# Patient Record
Sex: Male | Born: 2000 | Race: Black or African American | Hispanic: No | Marital: Single | Smoking: Former smoker
Health system: Southern US, Community
[De-identification: ages and names within clinical notes are randomized; demographics above are authoritative.]

---
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
## 2018-03-19 ENCOUNTER — Ambulatory Visit: Payer: Medicaid Other | Admitting: Neurology

## 2018-03-19 ENCOUNTER — Encounter

## 2018-10-02 IMAGING — MR MR HEAD WO/W CM
12 series · 42 of 48 positions shown · IV contrast (multihance)
Comparison: None.

CLINICAL DATA: BILATERAL tinnitus, RIGHT being worse. Previous
episode of syncope.

EXAM:
MRI HEAD WITHOUT AND WITH CONTRAST
TECHNIQUE: Multiplanar, multiecho pulse sequences of the brain and surrounding
structures were obtained without and with intravenous contrast.
CONTRAST:  14mL MULTIHANCE GADOBENATE DIMEGLUMINE 529 MG/ML IV SOLN

[Series 2: T1 · sagittal · 5.0mm · 0.45mm/px · 2 of 21 slices shown (1 of 3)]
[im 1/21]
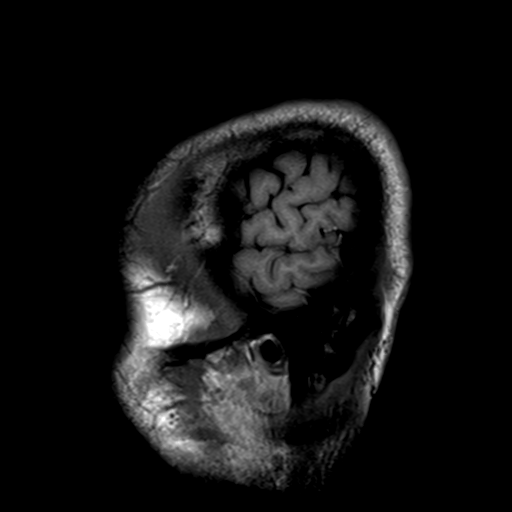
[im 21/21]
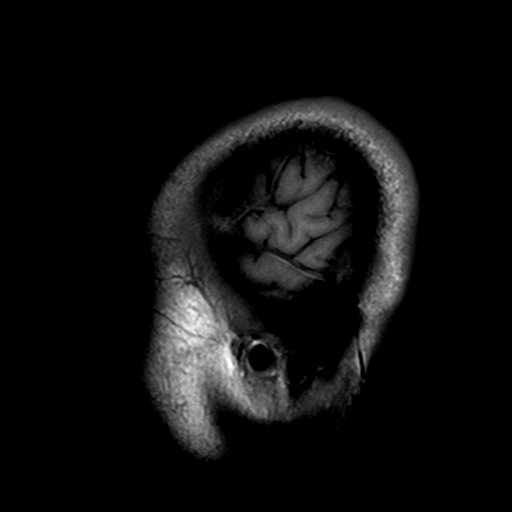

[Series 3: DWI · axial · 3.0mm · 1.80mm/px · z∈[-66,+81]mm · 8 of 98 slices shown (1 of 2)]
[im 1/98]
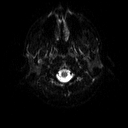
[im 11/98]
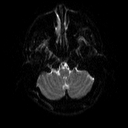
[im 33/98]
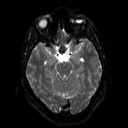
[im 44/98]
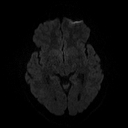
[im 54/98]
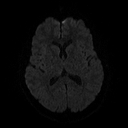
[im 65/98]
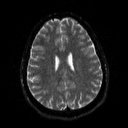
[im 87/98]
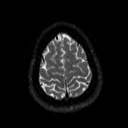
[im 98/98]
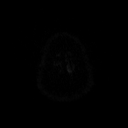

[Series 4: DWI · axial · 3.0mm · 1.80mm/px · z∈[-66,+81]mm · 5 of 48 slices shown (2 of 2)]
[im 1/48]
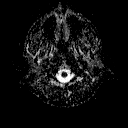
[im 12/48]
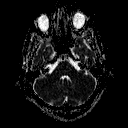
[im 24/48]
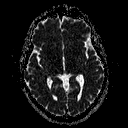
[im 36/48]
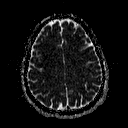
[im 48/48]
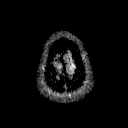

[Series 5: T2 · axial · 5.0mm · 0.51mm/px · z∈[-70,+86]mm · 3 of 25 slices shown]
[im 1/25]
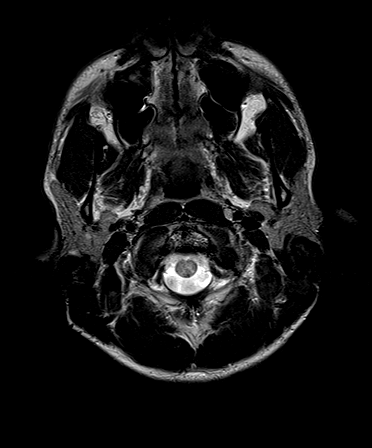
[im 13/25]
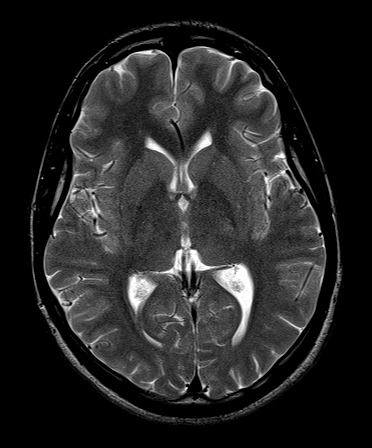
[im 25/25]
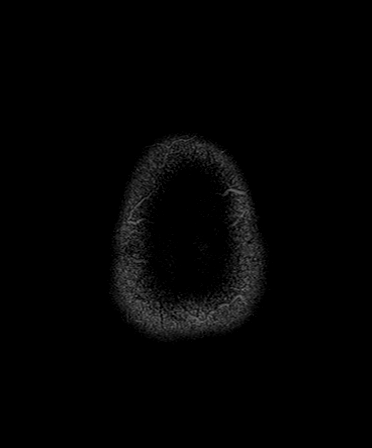

[Series 6: FLAIR · axial · 3.0mm · 0.45mm/px · z∈[-70,+86]mm · 3 of 27 slices shown]
[im 1/27]
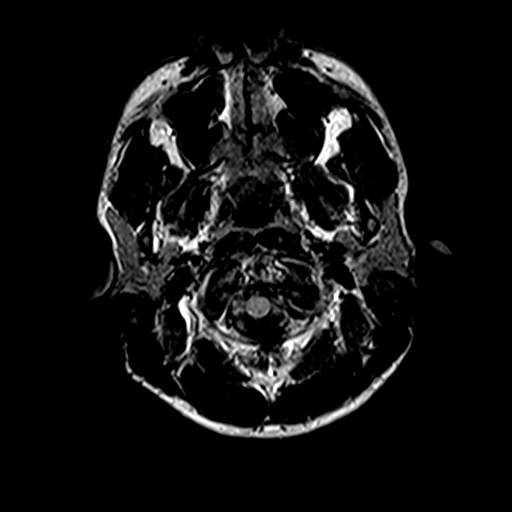
[im 14/27]
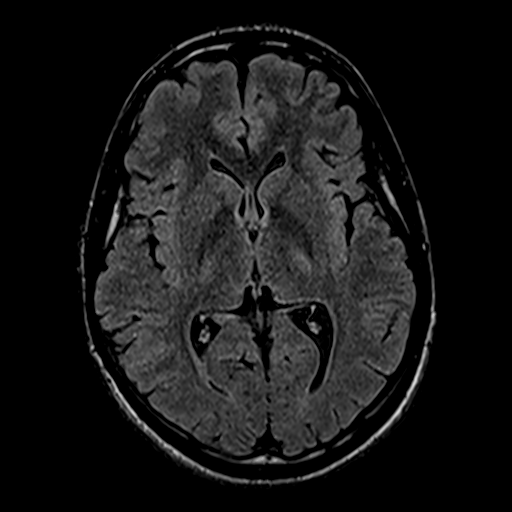
[im 27/27]
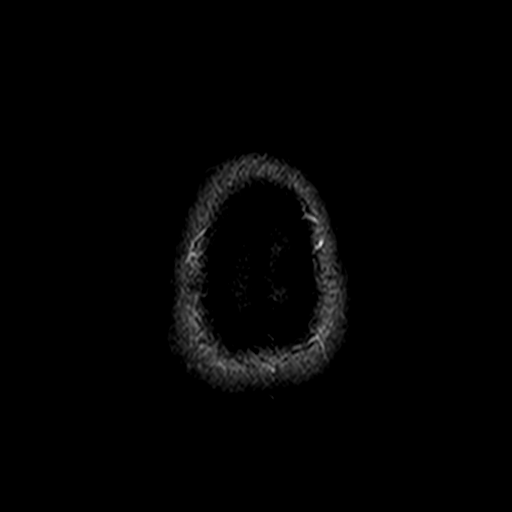

[Series 8: swi_images · axial · 2.0mm · 0.90mm/px · z∈[-71,+87]mm · 8 of 80 slices shown]
[im 1/80]
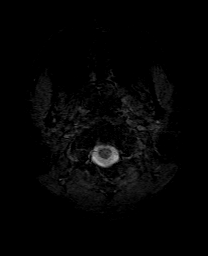
[im 10/80]
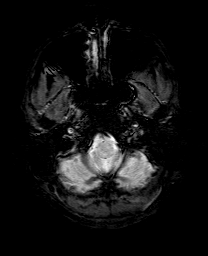
[im 20/80]
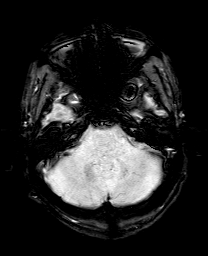
[im 30/80]
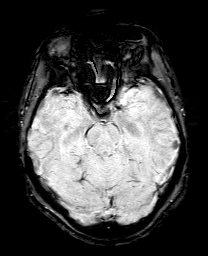
[im 50/80]
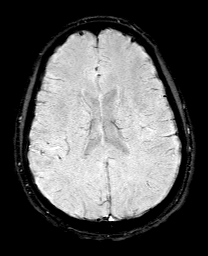
[im 60/80]
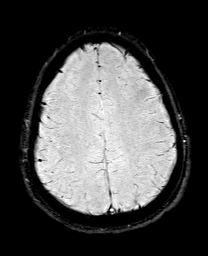
[im 70/80]
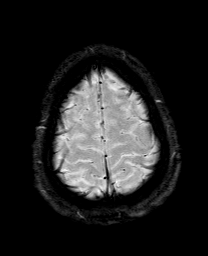
[im 80/80]
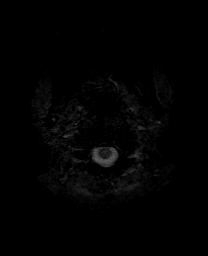

[Series 9: T1 · coronal · 3.0mm · 0.35mm/px · 1 of 11 slices shown (2 of 3)]
[im 1/11]
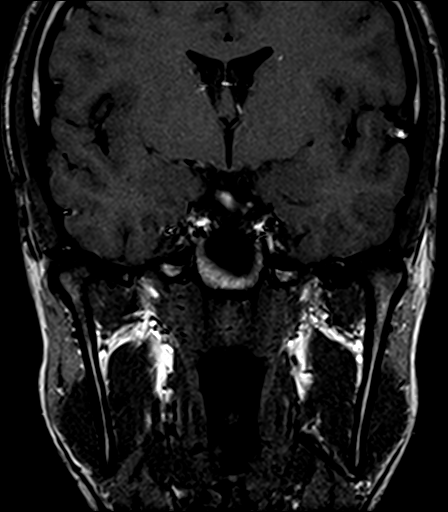

[Series 10: T1 · axial · 3.0mm · 0.35mm/px · 1 of 11 slices shown (3 of 3)]
[im 1/11]
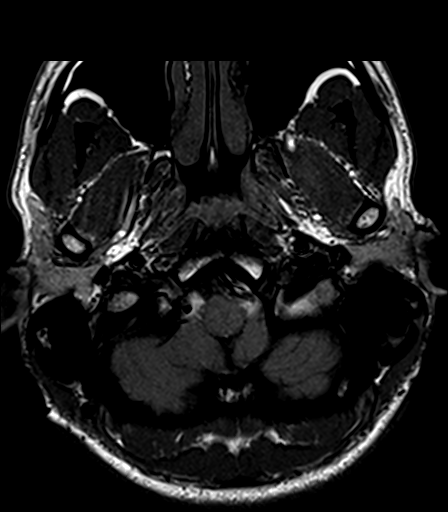

[Series 12: bSSFP · axial · 1.0mm · 0.47mm/px · z∈[-52,-17]mm · 4 of 36 slices shown]
[im 1/36]
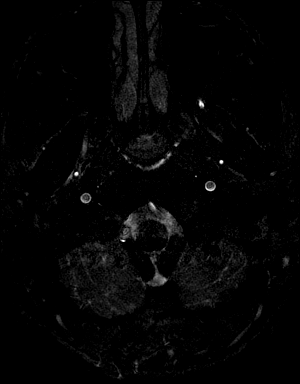
[im 12/36]
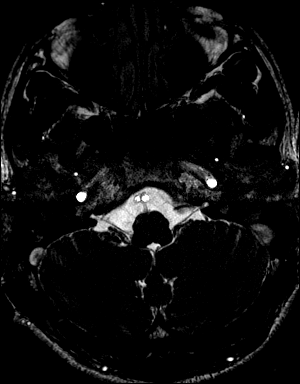
[im 24/36]
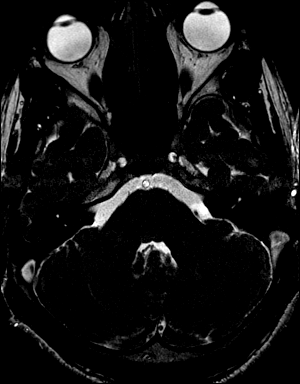
[im 36/36]
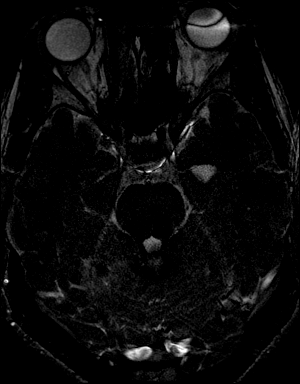

[Series 13: T1 post-contrast · coronal · 3.0mm · 0.35mm/px · 1 of 11 slices shown (1 of 2)]
[im 1/11]
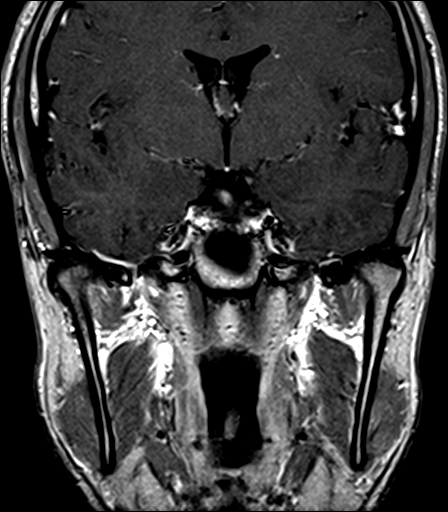

[Series 14: T1 post-contrast · axial · 3.0mm · 0.35mm/px · 1 of 11 slices shown (2 of 2)]
[im 1/11]
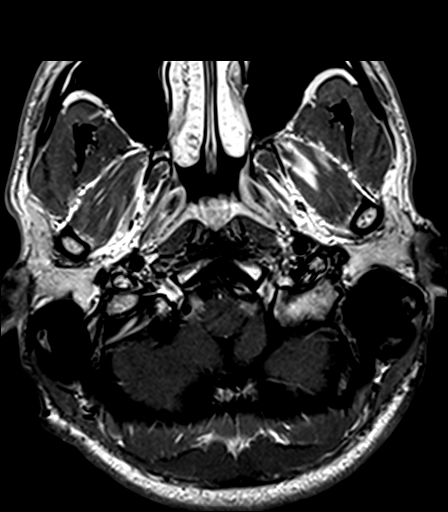

[Series 15: post_t1_mpr_tra · axial · 2.0mm · 0.45mm/px · z∈[-63,+17]mm · 5 of 72 slices shown]
[im 1/72]
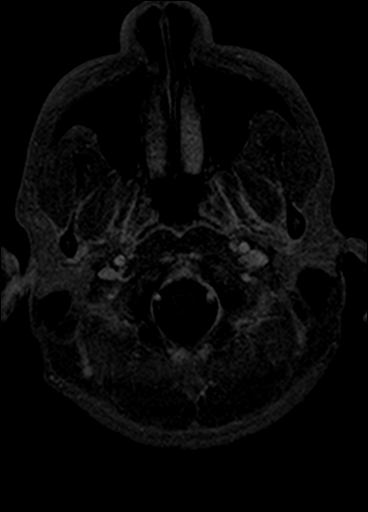
[im 11/72]
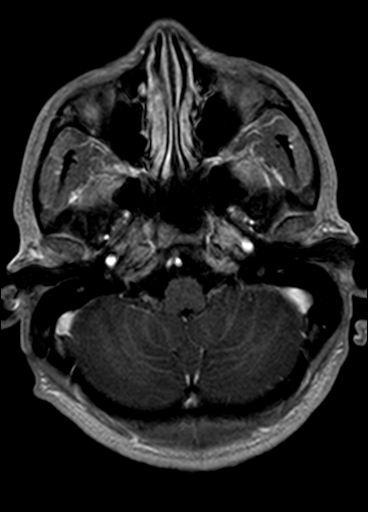
[im 21/72]
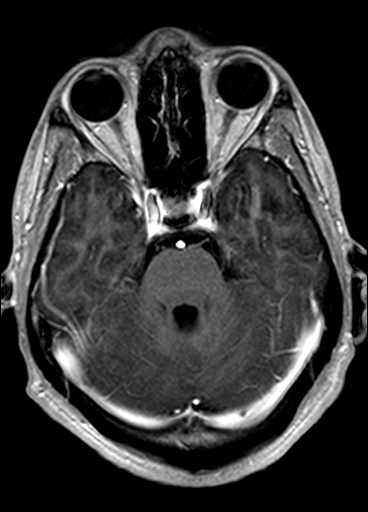
[im 31/72]
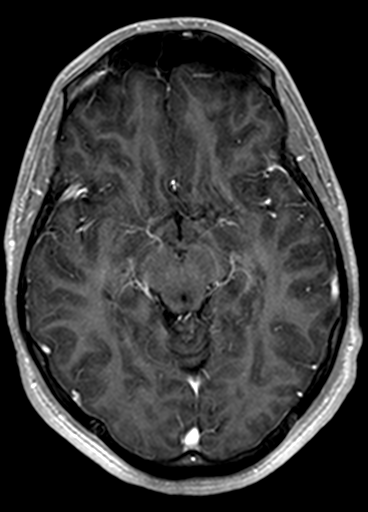
[im 41/72]
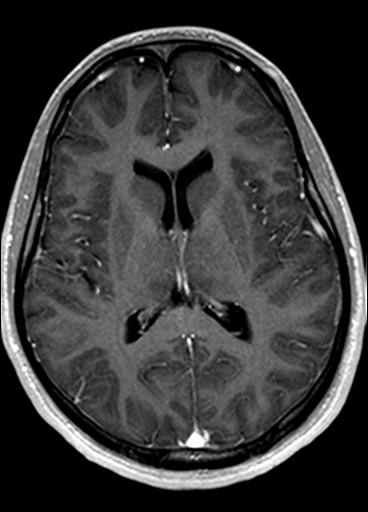

[42 of 48 positions shown; findings below may reference images not displayed]

FINDINGS: Brain: No evidence for acute infarction, hemorrhage, mass lesion,
hydrocephalus, or extra-axial fluid.

Normal cerebral volume. No white matter disease. Unremarkable
temporal lobes. No significant asymmetry or mass.

Thin-section imaging through the posterior fossa reveals normal
seventh and eighth nerve complex. No evidence for vestibular
schwannoma, posterior fossa mass, or temporal bone inflammatory
process. No vascular loop. Post infusion imaging through the entire
head demonstrates no abnormal enhancement of brain or meninges

Vascular: Normal flow voids.

Skull and upper cervical spine: Normal marrow signal.

Sinuses/Orbits: No significant paranasal sinus disease.

Other: None.
IMPRESSION: Negative exam.  No cause is seen for the reported symptoms.

## 2022-04-15 ENCOUNTER — Other Ambulatory Visit: Payer: Self-pay

## 2022-04-15 ENCOUNTER — Emergency Department (HOSPITAL_COMMUNITY)
Admission: EM | Admit: 2022-04-15 | Discharge: 2022-04-16 | Disposition: A | Discharge status: Home / Self Care | Payer: Managed Care, Other (non HMO) | Attending: Emergency Medicine | Admitting: Emergency Medicine

## 2022-04-15 ENCOUNTER — Encounter (HOSPITAL_COMMUNITY): Payer: Self-pay | Admitting: Emergency Medicine

## 2022-04-15 DIAGNOSIS — R110 Nausea: Secondary | ICD-10-CM | POA: Diagnosis not present

## 2022-04-15 DIAGNOSIS — R1031 Right lower quadrant pain: Secondary | ICD-10-CM | POA: Insufficient documentation

## 2022-04-15 LAB — URINALYSIS, ROUTINE W REFLEX MICROSCOPIC
Bilirubin Urine: NEGATIVE
Glucose, UA: NEGATIVE mg/dL
Hgb urine dipstick: NEGATIVE
Ketones, ur: NEGATIVE mg/dL
Leukocytes,Ua: NEGATIVE
Nitrite: NEGATIVE
Protein, ur: NEGATIVE mg/dL
Specific Gravity, Urine: 1.019 (ref 1.005–1.030)
pH: 6 (ref 5.0–8.0)

## 2022-04-15 LAB — CBC
HCT: 38.4 % — ABNORMAL LOW (ref 39.0–52.0)
Hemoglobin: 13 g/dL (ref 13.0–17.0)
MCH: 28 pg (ref 26.0–34.0)
MCHC: 33.9 g/dL (ref 30.0–36.0)
MCV: 82.8 fL (ref 80.0–100.0)
Platelets: 201 10*3/uL (ref 150–400)
RBC: 4.64 MIL/uL (ref 4.22–5.81)
RDW: 13.6 % (ref 11.5–15.5)
WBC: 6.8 10*3/uL (ref 4.0–10.5)
nRBC: 0 % (ref 0.0–0.2)

## 2022-04-15 NOTE — ED Triage Notes (Signed)
Pt c/o RLQ pain and nausea that started 2 hours ago. Denies vomiting/diarrhea.

## 2022-04-16 ENCOUNTER — Emergency Department (HOSPITAL_COMMUNITY): Payer: Managed Care, Other (non HMO)

## 2022-04-16 ENCOUNTER — Encounter (HOSPITAL_COMMUNITY): Payer: Self-pay

## 2022-04-16 LAB — COMPREHENSIVE METABOLIC PANEL
ALT: 12 U/L (ref 0–44)
AST: 21 U/L (ref 15–41)
Albumin: 4.2 g/dL (ref 3.5–5.0)
Alkaline Phosphatase: 39 U/L (ref 38–126)
Anion gap: 11 (ref 5–15)
BUN: 15 mg/dL (ref 6–20)
CO2: 25 mmol/L (ref 22–32)
Calcium: 9.4 mg/dL (ref 8.9–10.3)
Chloride: 104 mmol/L (ref 98–111)
Creatinine, Ser: 1.19 mg/dL (ref 0.61–1.24)
GFR, Estimated: >60 mL/min (ref >60)
Glucose, Bld: 102 mg/dL — ABNORMAL HIGH (ref 70–99)
Potassium: 3.2 mmol/L — ABNORMAL LOW (ref 3.5–5.1)
Sodium: 140 mmol/L (ref 135–145)
Total Bilirubin: 0.5 mg/dL (ref 0.3–1.2)
Total Protein: 6.6 g/dL (ref 6.5–8.1)

## 2022-04-16 LAB — LIPASE, BLOOD: Lipase: 26 U/L (ref 11–51)

## 2022-04-16 MED ORDER — LACTATED RINGERS IV BOLUS
1000.0000 mL | Freq: Once | INTRAVENOUS | Status: AC
  Administered 2022-04-16: 1000 mL via INTRAVENOUS

## 2022-04-16 MED ORDER — IOHEXOL 350 MG/ML SOLN
100.0000 mL | Freq: Once | INTRAVENOUS | Status: AC | PRN
  Administered 2022-04-16: 75 mL via INTRAVENOUS

## 2022-04-16 NOTE — ED Notes (Signed)
Patient transported to CT 

## 2022-04-16 NOTE — ED Provider Notes (Signed)
[facility] EMERGENCY DEPARTMENT Provider Note   CSN: [id number] Arrival date & time: 04/15/22  2313     History  Chief Complaint  Patient presents with   Abdominal Pain    Tyler Gill is a 21 y.o. male.  Patient presents to the emergency department for evaluation of abdominal pain.  Patient developed right lower abdominal pain 2 hours before coming to the ER.  Patient reports nausea, no vomiting or diarrhea.  Pain has eased off somewhat since it started but is still present.       Home Medications Prior to Admission medications   Medication Sig Start Date End Date Taking? Authorizing Provider  Omega-3 1000 MG CAPS Take 1 capsule by mouth daily.    [provider]  sertraline (ZOLOFT) 25 MG tablet Take 1 tablet daily 06/24/17   Van Clines, MD      Allergies    Patient has no known allergies.    Review of Systems   Review of Systems  Physical Exam Updated Vital Signs BP 110/68   Pulse 66   Temp 98 F (36.7 C) (Oral)   Resp 12   SpO2 99%  Physical Exam Vitals and nursing note reviewed.  Constitutional:      General: He is not in acute distress.    Appearance: He is well-developed.  HENT:     Head: Normocephalic and atraumatic.     Mouth/Throat:     Mouth: Mucous membranes are moist.  Eyes:     General: Vision grossly intact. Gaze aligned appropriately.     Extraocular Movements: Extraocular movements intact.     Conjunctiva/sclera: Conjunctivae normal.  Cardiovascular:     Rate and Rhythm: Normal rate and regular rhythm.     Pulses: Normal pulses.     Heart sounds: Normal heart sounds, S1 normal and S2 normal. No murmur heard.    No friction rub. No gallop.  Pulmonary:     Effort: Pulmonary effort is normal. No respiratory distress.     Breath sounds: Normal breath sounds.  Abdominal:     Palpations: Abdomen is soft.     Tenderness: There is abdominal tenderness in the right lower quadrant. There is no guarding or  rebound.     Hernia: No hernia is present.  Musculoskeletal:        General: No swelling.     Cervical back: Full passive range of motion without pain, normal range of motion and neck supple. No pain with movement, spinous process tenderness or muscular tenderness. Normal range of motion.     Right lower leg: No edema.     Left lower leg: No edema.  Skin:    General: Skin is warm and dry.     Capillary Refill: Capillary refill takes less than 2 seconds.     Findings: No ecchymosis, erythema, lesion or wound.  Neurological:     Mental Status: He is alert and oriented to person, place, and time.     GCS: GCS eye subscore is 4. GCS verbal subscore is 5. GCS motor subscore is 6.     Cranial Nerves: Cranial nerves 2-12 are intact.     Sensory: Sensation is intact.     Motor: Motor function is intact. No weakness or abnormal muscle tone.     Coordination: Coordination is intact.  Psychiatric:        Mood and Affect: Mood normal.        Speech: Speech normal.  Behavior: Behavior normal.     ED Results / Procedures / Treatments   Labs (all labs ordered are listed, but only abnormal results are displayed) Labs Reviewed  COMPREHENSIVE METABOLIC PANEL - Abnormal; Notable for the following components:      Result Value   Potassium 3.2 (*)    Glucose, Bld 102 (*)    All other components within normal limits  CBC - Abnormal; Notable for the following components:   HCT 38.4 (*)    All other components within normal limits  LIPASE, BLOOD  URINALYSIS, ROUTINE W REFLEX MICROSCOPIC    EKG EKG Interpretation  Date/Time:  Tuesday April 15 2022 23:21:14 EDT Ventricular Rate:  79 PR Interval:  154 QRS Duration: 102 QT Interval:  382 QTC Calculation: 438 R Axis:   90 Text Interpretation: Normal sinus rhythm Rightward axis Borderline ECG No previous ECGs available Confirmed by Gilda Crease [phone]) on 04/16/2022 12:12:07 AM  Radiology CT ABDOMEN PELVIS W  CONTRAST  Result Date: 04/16/2022 CLINICAL DATA:  Right lower quadrant abdominal pain EXAM: CT ABDOMEN AND PELVIS WITH CONTRAST TECHNIQUE: Multidetector CT imaging of the abdomen and pelvis was performed using the standard protocol following bolus administration of intravenous contrast. RADIATION DOSE REDUCTION: This exam was performed according to the departmental dose-optimization program which includes automated exposure control, adjustment of the mA and/or kV according to patient size and/or use of iterative reconstruction technique. CONTRAST:  4mL OMNIPAQUE IOHEXOL 350 MG/ML SOLN COMPARISON:  None Available. FINDINGS: Lower Chest: Normal. Hepatobiliary: Normal hepatic contours. No intra- or extrahepatic biliary dilatation. There is cholelithiasis without acute inflammation. Pancreas: Normal pancreas. No ductal dilatation or peripancreatic fluid collection. Spleen: Normal. Adrenals/Urinary Tract: The adrenal glands are normal. No hydronephrosis, nephroureterolithiasis or solid renal mass. The urinary bladder is normal for degree of distention Stomach/Bowel: There is no hiatal hernia. Normal duodenal course and caliber. No small bowel dilatation or inflammation. No focal colonic abnormality. Normal appendix. Vascular/Lymphatic: Normal course and caliber of the major abdominal vessels. No abdominal or pelvic lymphadenopathy. Reproductive: Normal prostate size with symmetric seminal vesicles. Other: None. Musculoskeletal: No bony spinal canal stenosis or focal osseous abnormality. IMPRESSION: 1. No acute abnormality of the abdomen or pelvis. 2. Cholelithiasis without acute inflammation. Electronically Signed   By: Deatra Robinson M.D.   On: 04/16/2022 01:32    Procedures Procedures    Medications Ordered in ED Medications  lactated ringers bolus 1,000 mL (1,000 mLs Intravenous New Bag/Given 04/16/22 0043)  iohexol (OMNIPAQUE) 350 MG/ML injection 100 mL (75 mLs Intravenous Contrast Given 04/16/22 0125)     ED Course/ Medical Decision Making/ A&P                           Medical Decision Making Amount and/or Complexity of Data Reviewed Labs: ordered. Radiology: ordered.  Risk Prescription drug management.   Presents to the emergency department for evaluation of abdominal pain.  Patient with several hours of right lower quadrant abdominal pain associated with nausea.  Patient reports progressive improvement of the pain prior to my exam.  Examination revealed mild tenderness in the right lower quadrant without guarding.  No signs of peritonitis.  Concern for possible early appendicitis based on exam.  Lab work was normal.  He is afebrile with normal vital signs.  Patient sent for CT scan to further evaluate.  Appendix is visualized and is normal.  Remainder of the exam is normal, no acute findings.  Final Clinical Impression(s) / ED Diagnoses Final diagnoses:  Right lower quadrant abdominal pain    Rx / DC Orders ED Discharge Orders     None         Kynsli Haapala, Canary Brim, MD 04/16/22 0144
